# Patient Record
Sex: Female | Born: 1965 | Race: White | Hispanic: No | Marital: Married | Smoking: Never smoker
Health system: Southern US, Community
[De-identification: ages and names within clinical notes are randomized; demographics above are authoritative.]

## PROBLEM LIST (undated history)

## (undated) DIAGNOSIS — I82409 Acute embolism and thrombosis of unspecified deep veins of unspecified lower extremity: Secondary | ICD-10-CM

## (undated) DIAGNOSIS — Z973 Presence of spectacles and contact lenses: Secondary | ICD-10-CM

## (undated) DIAGNOSIS — G43909 Migraine, unspecified, not intractable, without status migrainosus: Secondary | ICD-10-CM

## (undated) DIAGNOSIS — E041 Nontoxic single thyroid nodule: Secondary | ICD-10-CM

## (undated) DIAGNOSIS — Z8489 Family history of other specified conditions: Secondary | ICD-10-CM

## (undated) DIAGNOSIS — I499 Cardiac arrhythmia, unspecified: Secondary | ICD-10-CM

## (undated) HISTORY — PX: CARPAL TUNNEL RELEASE: SHX101

---

## 2014-12-06 HISTORY — PX: COLONOSCOPY: SHX174

## 2019-10-01 ENCOUNTER — Other Ambulatory Visit: Payer: Self-pay

## 2019-10-01 ENCOUNTER — Ambulatory Visit: Admission: EM | Admit: 2019-10-01 | Discharge: 2019-10-01 | Discharge status: Against Medical Advice | Payer: 59

## 2019-10-01 NOTE — ED Triage Notes (Addendum)
Pt reports migraine that started yesterday AM. Pt took 50mg  her own Sumatriptan migraine medication and is wanting a refill.  During triage process, while patient is in treatment room,  patient's husband discovered that pt had 1 more refill left at [facility] and informs patient. Pt states that she wants to leave before being seen by doctor in order to avoid getting charged. This RN informed patient that Provider would be happy to see and treat patient, pt declines. Husband in lobby. Pt alert and oriented X4, cooperative, RR even and unlabored, color WNL. Pt in NAD. Pt states that Sumatriptan helps her migraines, hx of migraines. Pt ambulatory upon leaving prior to being seen by provider.

## 2019-11-14 ENCOUNTER — Encounter: Payer: Self-pay | Admitting: Family Medicine

## 2019-11-15 ENCOUNTER — Telehealth: Payer: Self-pay

## 2019-11-15 ENCOUNTER — Other Ambulatory Visit: Payer: Self-pay

## 2019-11-15 DIAGNOSIS — Z800 Family history of malignant neoplasm of digestive organs: Secondary | ICD-10-CM

## 2019-11-15 DIAGNOSIS — Z1211 Encounter for screening for malignant neoplasm of colon: Secondary | ICD-10-CM

## 2019-11-15 NOTE — Telephone Encounter (Signed)
Gastroenterology Pre-Procedure Review  Request Date: Monday 12/10/19 Requesting Physician: Dr. Allen Norris  PATIENT REVIEW QUESTIONS: The patient responded to the following health history questions as indicated:    1. Are you having any GI issues? no 2. Do you have a personal history of Polyps? no 3. Do you have a family history of Colon Cancer or Polyps? yes (mother colon cancer) 4. Diabetes Mellitus? no 5. Joint replacements in the past 12 months?no 6. Major health problems in the past 3 months?no 7. Any artificial heart valves, MVP, or defibrillator?no    MEDICATIONS & ALLERGIES:    Patient reports the following regarding taking any anticoagulation/antiplatelet therapy:   Plavix, Coumadin, Eliquis, Xarelto, Lovenox, Pradaxa, Brilinta, or Effient? no Aspirin? no  Patient confirms/reports the following medications:  No current outpatient medications on file.   No current facility-administered medications for this visit.    Patient confirms/reports the following allergies:  Not on File  No orders of the defined types were placed in this encounter.   AUTHORIZATION INFORMATION Primary Insurance: 1D#: Group #:  Secondary Insurance: 1D#: Group #:  SCHEDULE INFORMATION: Date: 12/10/19 Time: Location:[facility]

## 2019-11-28 ENCOUNTER — Encounter: Payer: Self-pay | Admitting: Gastroenterology

## 2019-11-28 ENCOUNTER — Other Ambulatory Visit: Payer: Self-pay

## 2019-12-03 ENCOUNTER — Other Ambulatory Visit: Payer: Self-pay

## 2019-12-03 MED ORDER — NA SULFATE-K SULFATE-MG SULF 17.5-3.13-1.6 GM/177ML PO SOLN: 1.0000 | Freq: Once | ORAL | 0 refills | Status: AC

## 2019-12-04 NOTE — Discharge Instructions (Signed)

## 2019-12-06 ENCOUNTER — Other Ambulatory Visit
Admission: RE | Admit: 2019-12-06 | Discharge: 2019-12-06 | Disposition: A | Discharge status: Home / Self Care | Payer: 59 | Source: Ambulatory Visit | Attending: Gastroenterology | Admitting: Gastroenterology

## 2019-12-06 DIAGNOSIS — Z01812 Encounter for preprocedural laboratory examination: Secondary | ICD-10-CM | POA: Insufficient documentation

## 2019-12-06 DIAGNOSIS — Z20828 Contact with and (suspected) exposure to other viral communicable diseases: Secondary | ICD-10-CM | POA: Diagnosis not present

## 2019-12-07 LAB — SARS CORONAVIRUS 2 (TAT 6-24 HRS): SARS Coronavirus 2: NEGATIVE

## 2019-12-10 ENCOUNTER — Encounter
Admission: RE | Disposition: A | Discharge status: Home / Self Care | Payer: Self-pay | Source: Home / Self Care | Attending: Gastroenterology

## 2019-12-10 ENCOUNTER — Ambulatory Visit: Payer: 59 | Admitting: Anesthesiology

## 2019-12-10 ENCOUNTER — Ambulatory Visit
Admission: RE | Admit: 2019-12-10 | Discharge: 2019-12-10 | Disposition: A | Discharge status: Home / Self Care | Payer: 59 | Attending: Gastroenterology | Admitting: Gastroenterology

## 2019-12-10 ENCOUNTER — Other Ambulatory Visit: Payer: Self-pay

## 2019-12-10 ENCOUNTER — Encounter: Payer: Self-pay | Admitting: Gastroenterology

## 2019-12-10 DIAGNOSIS — G43909 Migraine, unspecified, not intractable, without status migrainosus: Secondary | ICD-10-CM | POA: Diagnosis not present

## 2019-12-10 DIAGNOSIS — E669 Obesity, unspecified: Secondary | ICD-10-CM | POA: Insufficient documentation

## 2019-12-10 DIAGNOSIS — Z86010 Personal history of colonic polyps: Secondary | ICD-10-CM | POA: Insufficient documentation

## 2019-12-10 DIAGNOSIS — Z800 Family history of malignant neoplasm of digestive organs: Secondary | ICD-10-CM | POA: Diagnosis not present

## 2019-12-10 DIAGNOSIS — Z6833 Body mass index (BMI) 33.0-33.9, adult: Secondary | ICD-10-CM | POA: Diagnosis not present

## 2019-12-10 DIAGNOSIS — Z86718 Personal history of other venous thrombosis and embolism: Secondary | ICD-10-CM | POA: Insufficient documentation

## 2019-12-10 DIAGNOSIS — Z1211 Encounter for screening for malignant neoplasm of colon: Secondary | ICD-10-CM | POA: Insufficient documentation

## 2019-12-10 HISTORY — DX: Migraine, unspecified, not intractable, without status migrainosus: G43.909

## 2019-12-10 HISTORY — DX: Cardiac arrhythmia, unspecified: I49.9

## 2019-12-10 HISTORY — PX: COLONOSCOPY WITH PROPOFOL: SHX5780

## 2019-12-10 HISTORY — DX: Acute embolism and thrombosis of unspecified deep veins of unspecified lower extremity: I82.409

## 2019-12-10 HISTORY — DX: Nontoxic single thyroid nodule: E04.1

## 2019-12-10 HISTORY — DX: Presence of spectacles and contact lenses: Z97.3

## 2019-12-10 HISTORY — DX: Family history of other specified conditions: Z84.89

## 2019-12-10 SURGERY — COLONOSCOPY WITH PROPOFOL
Anesthesia: General | Site: Rectum

## 2019-12-10 MED ORDER — LIDOCAINE HCL (CARDIAC) PF 100 MG/5ML IV SOSY
PREFILLED_SYRINGE | INTRAVENOUS | Status: DC | PRN
  Administered 2019-12-10: 30 mg via INTRAVENOUS

## 2019-12-10 MED ORDER — PROPOFOL 10 MG/ML IV BOLUS
INTRAVENOUS | Status: DC | PRN
  Administered 2019-12-10: 40 mg via INTRAVENOUS
  Administered 2019-12-10 (×2): 20 mg via INTRAVENOUS
  Administered 2019-12-10: 50 mg via INTRAVENOUS
  Administered 2019-12-10: 20 mg via INTRAVENOUS
  Administered 2019-12-10: 50 mg via INTRAVENOUS
  Administered 2019-12-10: 20 mg via INTRAVENOUS
  Administered 2019-12-10: 50 mg via INTRAVENOUS
  Administered 2019-12-10: 100 mg via INTRAVENOUS
  Administered 2019-12-10: 30 mg via INTRAVENOUS

## 2019-12-10 MED ORDER — LACTATED RINGERS IV SOLN: INTRAVENOUS | Status: DC

## 2019-12-10 MED ORDER — STERILE WATER FOR IRRIGATION IR SOLN
Status: DC | PRN
  Administered 2019-12-10: 09:00:00 50 mL

## 2019-12-10 SURGICAL SUPPLY — 5 items
CANISTER SUCT 1200ML W/VALVE (MISCELLANEOUS) ×3 IMPLANT
GOWN CVR UNV OPN BCK APRN NK (MISCELLANEOUS) ×2 IMPLANT
GOWN ISOL THUMB LOOP REG UNIV (MISCELLANEOUS) ×4
KIT ENDO PROCEDURE OLY (KITS) ×3 IMPLANT
WATER STERILE IRR 250ML POUR (IV SOLUTION) ×3 IMPLANT

## 2019-12-10 NOTE — Anesthesia Procedure Notes (Signed)
Procedure Name: MAC Date/Time: 12/10/2019 8:30 AM Performed by: Georga Bora, CRNA Pre-anesthesia Checklist: Patient identified, Suction available, Emergency Drugs available, Patient being monitored and Timeout performed Patient Re-evaluated:Patient Re-evaluated prior to induction Oxygen Delivery Method: Nasal cannula

## 2019-12-10 NOTE — Anesthesia Postprocedure Evaluation (Signed)
Anesthesia Post Note  Patient: Brewing technologist  Procedure(s) Performed: COLONOSCOPY WITH PROPOFOL (N/A Rectum)     Patient location during evaluation: PACU Anesthesia Type: General Level of consciousness: awake and alert Pain management: pain level controlled Vital Signs Assessment: post-procedure vital signs reviewed and stable Respiratory status: spontaneous breathing, nonlabored ventilation, respiratory function stable and patient connected to nasal cannula oxygen Cardiovascular status: blood pressure returned to baseline and stable Postop Assessment: no apparent nausea or vomiting Anesthetic complications: no    Brittney Cooley

## 2019-12-10 NOTE — Transfer of Care (Signed)
Immediate Anesthesia Transfer of Care Note  Patient: Brittney Cooley  Procedure(s) Performed: COLONOSCOPY WITH PROPOFOL (N/A Rectum)  Patient Location: PACU  Anesthesia Type: General  Level of Consciousness: awake, alert  and patient cooperative  Airway and Oxygen Therapy: Patient Spontanous Breathing and Patient connected to supplemental oxygen  Post-op Assessment: Post-op Vital signs reviewed, Patient's Cardiovascular Status Stable, Respiratory Function Stable, Patent Airway and No signs of Nausea or vomiting  Post-op Vital Signs: Reviewed and stable  Complications: No apparent anesthesia complications

## 2019-12-10 NOTE — Anesthesia Preprocedure Evaluation (Addendum)
Anesthesia Evaluation  Patient identified by MRN, date of birth, ID band Patient awake    History of Anesthesia Complications (+) history of anesthetic complications ("slow to wake up")  Airway Mallampati: II  TM Distance: >3 FB   Mouth opening: Limited Mouth Opening  Dental no notable dental hx.    Pulmonary neg pulmonary ROS,    Pulmonary exam normal        Cardiovascular Exercise Tolerance: Good (-) anginanegative cardio ROS Normal cardiovascular exam     Neuro/Psych  Headaches (migraines),    GI/Hepatic negative GI ROS, Neg liver ROS,   Endo/Other  Obesity, BMI 33  Renal/GU negative Renal ROS     Musculoskeletal   Abdominal   Peds  Hematology negative hematology ROS (+)   Anesthesia Other Findings   Reproductive/Obstetrics                             Anesthesia Physical Anesthesia Plan  ASA: II  Anesthesia Plan: General   Post-op Pain Management:    Induction:   PONV Risk Score and Plan: 3 and TIVA, Propofol infusion and Treatment may vary due to age or medical condition  Airway Management Planned: Nasal Cannula and Natural Airway  Additional Equipment: None  Intra-op Plan:   Post-operative Plan:   Informed Consent: I have reviewed the patients History and Physical, chart, labs and discussed the procedure including the risks, benefits and alternatives for the proposed anesthesia with the patient or authorized representative who has indicated his/her understanding and acceptance.       Plan Discussed with: CRNA  Anesthesia Plan Comments:        Anesthesia Quick Evaluation

## 2019-12-10 NOTE — H&P (Signed)
Wyline Mood, MD [address], [address], [place], [place], [address] [address], [place], [place], [address] Phone: [phone]  Fax: [phone]  Primary Care Physician:  Dortha Kern, MD   Pre-Procedure History & Physical: HPI:  Brittney Cooley is a 54 y.o. female is here for an colonoscopy.   Past Medical History:  Diagnosis Date  . DVT (deep venous thrombosis) (HCC)    after Carpal tunnel surgery (approx 2017)  . Dysrhythmia    Pt reports occasional "skipped beat" years ago.  . Family history of adverse reaction to anesthesia    Mother - slow to wake  . Migraine headache    avg 2x/month  . Thyroid nodule   . Wears contact lenses    sometimes    Past Surgical History:  Procedure Laterality Date  . CARPAL TUNNEL RELEASE Bilateral    approx 2017.  [place]  . COLONOSCOPY  2016    Prior to Admission medications   Medication Sig Start Date End Date Taking? Authorizing Provider  SUMAtriptan (IMITREX) 100 MG tablet Take 50 mg by mouth every 2 (two) hours as needed for migraine. May repeat in 2 hours if headache persists or recurs.   Yes [provider]  valACYclovir (VALTREX) 500 MG tablet Take 500 mg by mouth 2 (two) times daily as needed.   Yes [provider]    Allergies as of 11/15/2019  . (Not on File)    History reviewed. No pertinent family history.  Social History   Socioeconomic History  . Marital status: Married    Spouse name: Not on file  . Number of children: Not on file  . Years of education: Not on file  . Highest education level: Not on file  Occupational History  . Not on file  Tobacco Use  . Smoking status: Never Smoker  . Smokeless tobacco: Never Used  Substance and Sexual Activity  . Alcohol use: Yes    Comment: wine on Holidays  . Drug use: Not on file  . Sexual activity: Not on file  Other Topics Concern  . Not on file  Social History Narrative  . Not on file   Social Determinants of Health     Financial Resource Strain:   . Difficulty of Paying Living Expenses: Not on file  Food Insecurity:   . Worried About Programme researcher, broadcasting/film/video in the Last Year: Not on file  . Ran Out of Food in the Last Year: Not on file  Transportation Needs:   . Lack of Transportation (Medical): Not on file  . Lack of Transportation (Non-Medical): Not on file  Physical Activity:   . Days of Exercise per Week: Not on file  . Minutes of Exercise per Session: Not on file  Stress:   . Feeling of Stress : Not on file  Social Connections:   . Frequency of Communication with Friends and Family: Not on file  . Frequency of Social Gatherings with Friends and Family: Not on file  . Attends Religious Services: Not on file  . Active Member of Clubs or Organizations: Not on file  . Attends Banker Meetings: Not on file  . Marital Status: Not on file  Intimate Partner Violence:   . Fear of Current or Ex-Partner: Not on file  . Emotionally Abused: Not on file  . Physically Abused: Not on file  . Sexually Abused: Not on file    Review of Systems: See HPI, otherwise negative ROS  Physical  Exam: BP (!) 116/51   Pulse 75   Temp (!) 97.5 F (36.4 C) (Temporal)   Resp 16   Ht 5\' 3"  (1.6 m)   Wt 85.3 kg   SpO2 97%   BMI 33.30 kg/m  General:   Alert,  pleasant and cooperative in NAD Head:  Normocephalic and atraumatic. Neck:  Supple; no masses or thyromegaly. Lungs:  Clear throughout to auscultation, normal respiratory effort.    Heart:  +S1, +S2, Regular rate and rhythm, No edema. Abdomen:  Soft, nontender and nondistended. Normal bowel sounds, without guarding, and without rebound.   Neurologic:  Alert and  oriented x4;  grossly normal neurologically.  Impression/Plan: Brittney Cooley is here for an colonoscopy to be performed for surveillance due to prior history of colon polyps .Last colonosocpy was in 2017 and she had polyps was told to return in 2021. Mother also had colon cancer and  passed in her 27's  Risks, benefits, limitations, and alternatives regarding  colonoscopy have been reviewed with the patient.  Questions have been answered.  All parties agreeable.   Jonathon Bellows, MD  12/10/2019, 8:01 AM

## 2019-12-10 NOTE — Op Note (Signed)
[facility] Gastroenterology Patient Name: Brittney Cooley Procedure Date: 12/10/2019 8:22 AM MRN: [id number] Account #: [id number] Date of Birth: 04-Jun-1966 Admit Type: Outpatient Age: 54 Room: [facility] OR ROOM 01 Gender: Female Note Status: Finalized Procedure:             Colonoscopy Indications:           High risk colon cancer surveillance: Personal history                         of colonic polyps Providers:             Wyline Mood MD, MD Referring MD:          Dortha Kern (Referring MD) Medicines:             Monitored Anesthesia Care Complications:         No immediate complications. Procedure:             Pre-Anesthesia Assessment:                        - Prior to the procedure, a History and Physical was                         performed, and patient medications, allergies and                         sensitivities were reviewed. The patient's tolerance                         of previous anesthesia was reviewed.                        - The risks and benefits of the procedure and the                         sedation options and risks were discussed with the                         patient. All questions were answered and informed                         consent was obtained.                        - ASA Grade Assessment: II - A patient with mild                         systemic disease.                        After obtaining informed consent, the colonoscope was                         passed under direct vision. Throughout the procedure,                         the patient's blood pressure, pulse, and oxygen                         saturations were monitored continuously. The was  introduced through the anus and advanced to the the                         cecum, identified by the appendiceal orifice. The                         colonoscopy was performed with ease. The patient                         tolerated the procedure well.  The quality of the bowel                         preparation was excellent. Findings:      The perianal and digital rectal examinations were normal.      The entire examined colon appeared normal on direct and retroflexion       views. Impression:            - The entire examined colon is normal on direct and                         retroflexion views.                        - No specimens collected. Recommendation:        - Discharge patient to home (with escort).                        - Resume previous diet.                        - Continue present medications.                        - Repeat colonoscopy in 5 years for surveillance. Procedure Code(s):     --- Professional ---                        [phone], Colonoscopy, flexible; diagnostic, including                         collection of specimen(s) by brushing or washing, when                         performed (separate procedure) Diagnosis Code(s):     --- Professional ---                        Z86.010, Personal history of colonic polyps CPT copyright 2019 American Medical Association. All rights reserved. The codes documented in this report are preliminary and upon coder review may  be revised to meet current compliance requirements. Jonathon Bellows, MD Jonathon Bellows MD, MD 12/10/2019 8:57:37 AM This report has been signed electronically. Number of Addenda: 0 Note Initiated On: 12/10/2019 8:22 AM Scope Withdrawal Time: 0 hours 17 minutes 22 seconds  Total Procedure Duration: 0 hours 22 minutes 0 seconds  Estimated Blood Loss:  Estimated blood loss: none.      [facility]

## 2019-12-11 ENCOUNTER — Encounter: Payer: Self-pay | Admitting: *Deleted

## 2020-01-31 ENCOUNTER — Other Ambulatory Visit: Payer: Self-pay | Admitting: Family Medicine

## 2020-01-31 DIAGNOSIS — E041 Nontoxic single thyroid nodule: Secondary | ICD-10-CM

## 2020-02-05 ENCOUNTER — Encounter (INDEPENDENT_AMBULATORY_CARE_PROVIDER_SITE_OTHER): Payer: Self-pay

## 2020-02-05 ENCOUNTER — Other Ambulatory Visit: Payer: Self-pay

## 2020-02-05 ENCOUNTER — Ambulatory Visit
Admission: RE | Admit: 2020-02-05 | Discharge: 2020-02-05 | Disposition: A | Discharge status: Home / Self Care | Payer: 59 | Source: Ambulatory Visit | Attending: Family Medicine | Admitting: Family Medicine

## 2020-02-05 DIAGNOSIS — E041 Nontoxic single thyroid nodule: Secondary | ICD-10-CM | POA: Diagnosis present

## 2021-03-12 ENCOUNTER — Other Ambulatory Visit: Payer: Self-pay | Admitting: Family Medicine

## 2021-03-12 DIAGNOSIS — E041 Nontoxic single thyroid nodule: Secondary | ICD-10-CM

## 2021-04-28 ENCOUNTER — Ambulatory Visit
Admission: RE | Admit: 2021-04-28 | Discharge: 2021-04-28 | Disposition: A | Discharge status: Home / Self Care | Payer: 59 | Source: Ambulatory Visit | Attending: Family Medicine | Admitting: Family Medicine

## 2021-04-28 DIAGNOSIS — E041 Nontoxic single thyroid nodule: Secondary | ICD-10-CM | POA: Diagnosis present

## 2021-06-27 ENCOUNTER — Other Ambulatory Visit: Payer: Self-pay

## 2021-06-27 ENCOUNTER — Ambulatory Visit (INDEPENDENT_AMBULATORY_CARE_PROVIDER_SITE_OTHER): Payer: 59

## 2021-06-27 ENCOUNTER — Encounter: Payer: Self-pay | Admitting: Emergency Medicine

## 2021-06-27 ENCOUNTER — Ambulatory Visit
Admission: EM | Admit: 2021-06-27 | Discharge: 2021-06-27 | Disposition: A | Discharge status: Home / Self Care | Payer: 59 | Attending: Sports Medicine | Admitting: Sports Medicine

## 2021-06-27 ENCOUNTER — Ambulatory Visit
Admission: RE | Admit: 2021-06-27 | Discharge: 2021-06-27 | Disposition: A | Discharge status: Home / Self Care | Payer: 59 | Source: Ambulatory Visit

## 2021-06-27 DIAGNOSIS — M79642 Pain in left hand: Secondary | ICD-10-CM

## 2021-06-27 DIAGNOSIS — W19XXXA Unspecified fall, initial encounter: Secondary | ICD-10-CM

## 2021-06-27 DIAGNOSIS — T07XXXA Unspecified multiple injuries, initial encounter: Secondary | ICD-10-CM

## 2021-06-27 DIAGNOSIS — S62327A Displaced fracture of shaft of fifth metacarpal bone, left hand, initial encounter for closed fracture: Secondary | ICD-10-CM

## 2021-06-27 NOTE — Discharge Instructions (Addendum)
-  Your hand is fractured.  You will need to follow-up with Ortho.  We placed you in a splint today.  Wear this as a cast.  Do not get it wet.  You can take ibuprofen and Tylenol as needed for pain.  Elevate the extremity.  Follow-up with Ortho in the next couple of days.  You have a condition requiring you to follow up with Orthopedics so please call one of the following office for appointment:   [facility] [address] [place], [place] [address] Phone: [phone]  [facility] [address] [place], [place] [address] Phone: [phone]

## 2021-06-27 NOTE — ED Provider Notes (Signed)
[facility]    CSN: [id number] Arrival date & time: 06/27/21  1006      History   Chief Complaint Chief Complaint  Patient presents with   Hand Pain    left    HPI Brittney Cooley is a 55 y.o. female presenting for left hand pain and swelling following an accidental fall from her porch last night.  She says she has most of the pain about the fifth digit and metacarpal of the fifth digit.  No numbness, tingling or weakness.  Patient is left-handed.  States that she stuck her hand out to brace herself in the fall.  She also has abrasions of her knees and left elbow.  No significant pain of her knees or elbow.  Patient most concerned about her hand.  She has iced the area and taking over-the-counter Tylenol and Motrin for discomfort which has seemed to help a little.  Patient says she has less pain whenever she does not move the hand.  Increased pain when she tries to make a fist.  Patient denies head injury or LOC.  No other complaints or concerns.  HPI  Past Medical History:  Diagnosis Date   DVT (deep venous thrombosis) (HCC)    after Carpal tunnel surgery (approx 2017)   Dysrhythmia    Pt reports occasional "skipped beat" years ago.   Family history of adverse reaction to anesthesia    Mother - slow to wake   Migraine headache    avg 2x/month   Thyroid nodule    Wears contact lenses    sometimes    There are no problems to display for this patient.   Past Surgical History:  Procedure Laterality Date   CARPAL TUNNEL RELEASE Bilateral    approx 2017.  [place]   COLONOSCOPY  2016   COLONOSCOPY WITH PROPOFOL N/A 12/10/2019   Procedure: COLONOSCOPY WITH PROPOFOL;  Surgeon: Wyline Mood, MD;  Location: [facility];  Service: Endoscopy;  Laterality: N/A;    OB History   No obstetric history on file.      Home Medications    Prior to Admission medications   Medication Sig Start Date End Date Taking? Authorizing Provider  SUMAtriptan (IMITREX)  100 MG tablet Take 50 mg by mouth every 2 (two) hours as needed for migraine. May repeat in 2 hours if headache persists or recurs.   Yes [provider]  valACYclovir (VALTREX) 500 MG tablet Take 500 mg by mouth 2 (two) times daily as needed.   Yes [provider]    Family History History reviewed. No pertinent family history.  Social History Social History   Tobacco Use   Smoking status: Never   Smokeless tobacco: Never  Vaping Use   Vaping Use: Never used  Substance Use Topics   Alcohol use: Yes    Comment: wine on Holidays   Drug use: Not Currently     Allergies   Patient has no known allergies.   Review of Systems Review of Systems  Musculoskeletal:  Positive for arthralgias and joint swelling. Negative for gait problem.  Skin:  Positive for color change and wound.  Neurological:  Negative for syncope, weakness and numbness.    Physical Exam Triage Vital Signs ED Triage Vitals  Enc Vitals Group     BP 06/27/21 1020 110/71     Pulse Rate 06/27/21 1020 66     Resp 06/27/21 1020 18     Temp 06/27/21 1020 98.2 F (36.8 C)  Temp Source 06/27/21 1020 Oral     SpO2 06/27/21 1020 100 %     Weight 06/27/21 1017 190 lb (86.2 kg)     Height 06/27/21 1017 5\' 3"  (1.6 m)     Head Circumference --      Peak Flow --      Pain Score 06/27/21 1016 4     Pain Loc --      Pain Edu? --      Excl. in GC? --    No data found.  Updated Vital Signs BP 110/71 (BP Location: Left Arm)   Pulse 66   Temp 98.2 F (36.8 C) (Oral)   Resp 18   Ht 5\' 3"  (1.6 m)   Wt 190 lb (86.2 kg)   SpO2 100%   BMI 33.66 kg/m      Physical Exam Vitals and nursing note reviewed.  Constitutional:      General: She is not in acute distress.    Appearance: Normal appearance. She is not ill-appearing or toxic-appearing.  HENT:     Head: Normocephalic and atraumatic.  Eyes:     General: No scleral icterus.       Right eye: No discharge.        Left eye: No discharge.      Extraocular Movements: Extraocular movements intact.     Conjunctiva/sclera: Conjunctivae normal.     Pupils: Pupils are equal, round, and reactive to light.  Cardiovascular:     Rate and Rhythm: Normal rate and regular rhythm.     Pulses: Normal pulses.     Heart sounds: Normal heart sounds.  Pulmonary:     Effort: Pulmonary effort is normal. No respiratory distress.     Breath sounds: Normal breath sounds.  Musculoskeletal:     Cervical back: Neck supple.     Comments: Left hand: Moderate swelling over the fifth metacarpal and MCP.  Diffuse tenderness in this region.  Also has minor overlying ecchymosis in this area.  Decreased range of motion with trying to make a fist.  Full range of motion of the fifth digit.  Good pulses, strength and sensation.  Skin:    General: Skin is dry.     Findings: Lesion (multiple superficial abrasions of bilateral anterior knees and left elbow.) present.  Neurological:     General: No focal deficit present.     Mental Status: She is alert and oriented to person, place, and time. Mental status is at baseline.     Cranial Nerves: No cranial nerve deficit.     Motor: No weakness.     Gait: Gait normal.  Psychiatric:        Mood and Affect: Mood normal.        Behavior: Behavior normal.        Thought Content: Thought content normal.     UC Treatments / Results  Labs (all labs ordered are listed, but only abnormal results are displayed) Labs Reviewed - No data to display  EKG   Radiology DG Hand Complete Left  Result Date: 06/27/2021 CLINICAL DATA:  Pain after fall. EXAM: LEFT HAND - COMPLETE 3+ VIEW COMPARISON:  None. FINDINGS: There is a fracture through the proximal fifth metacarpal diaphysis with displacement. IMPRESSION: There is a fracture through the proximal fifth metacarpal diaphysis with displacement. Electronically Signed   By: III M.D   On: 06/27/2021 10:34    Procedures Procedures (including critical care  time)  Medications Ordered in UC Medications -  No data to display  Initial Impression / Assessment and Plan / UC Course  I have reviewed the triage vital signs and the nursing notes.  Pertinent labs & imaging results that were available during my care of the patient were reviewed by me and considered in my medical decision making (see chart for details).  55 year old female presenting for multiple injuries following a accidental fall yesterday.  She has increased swelling, pain and bruising of the left hand specifically about the fifth metacarpal.  Of note she is left-hand dominant.  Additionally she has multiple abrasions of her knees and elbow.  She is up-to-date with her tetanus immunization.  She has clean these wounds and applied Band-Aids.  X-ray of left hand does show fracture through the proximal fifth metacarpal diaphysis with displacement.  Independently reviewed images. Reviewed this with patient.  Advised her this is a complicated fracture since it is displaced.  Advised her she will need to follow-up with orthopedics.  Patient immobilized with ulnar gutter splint.  Reviewed RICE guidelines and ibuprofen and Tylenol as needed for pain relief.  Advised her to go to [facility] or [facility] orthopedics in the next couple of days.  Reviewed wound care guidelines for her abrasions.  Advised follow-up for any signs of infection.  Final Clinical Impressions(s) / UC Diagnoses   Final diagnoses:  Closed displaced fracture of shaft of fifth metacarpal bone of left hand, initial encounter  Multiple abrasions  Fall, initial encounter     Discharge Instructions      -Your hand is fractured.  You will need to follow-up with Ortho.  We placed you in a splint today.  Wear this as a cast.  Do not get it wet.  You can take ibuprofen and Tylenol as needed for pain.  Elevate the extremity.  Follow-up with Ortho in the next couple of days.  You have a condition requiring you to follow up with  Orthopedics so please call one of the following office for appointment:   [facility] [address] [place], [place] [address] Phone: [phone]  [facility] [address] [place], [place] [address] Phone: [phone]      ED Prescriptions   None    PDMP not reviewed this encounter.   Shirlee Latch, PA-C 06/27/21 1432

## 2021-06-27 NOTE — ED Triage Notes (Signed)
Pt c/o left hand pain. She states she is fell off her porch last night. She has bruising and swelling.

## 2022-01-22 ENCOUNTER — Other Ambulatory Visit: Payer: Self-pay | Admitting: Family Medicine

## 2022-01-22 DIAGNOSIS — Z1231 Encounter for screening mammogram for malignant neoplasm of breast: Secondary | ICD-10-CM

## 2022-02-25 ENCOUNTER — Ambulatory Visit
Admission: RE | Admit: 2022-02-25 | Discharge: 2022-02-25 | Disposition: A | Discharge status: Home / Self Care | Payer: 59 | Source: Ambulatory Visit | Attending: Family Medicine | Admitting: Family Medicine

## 2022-02-25 ENCOUNTER — Other Ambulatory Visit: Payer: Self-pay

## 2022-02-25 DIAGNOSIS — Z1231 Encounter for screening mammogram for malignant neoplasm of breast: Secondary | ICD-10-CM | POA: Insufficient documentation

## 2022-03-25 ENCOUNTER — Other Ambulatory Visit: Payer: Self-pay | Admitting: Family Medicine

## 2022-03-25 DIAGNOSIS — E041 Nontoxic single thyroid nodule: Secondary | ICD-10-CM

## 2022-05-05 ENCOUNTER — Ambulatory Visit
Admission: RE | Admit: 2022-05-05 | Discharge: 2022-05-05 | Disposition: A | Discharge status: Home / Self Care | Payer: 59 | Source: Ambulatory Visit | Attending: Family Medicine | Admitting: Family Medicine

## 2022-05-05 DIAGNOSIS — E041 Nontoxic single thyroid nodule: Secondary | ICD-10-CM | POA: Insufficient documentation

## 2022-06-08 DIAGNOSIS — Z713 Dietary counseling and surveillance: Secondary | ICD-10-CM | POA: Insufficient documentation

## 2022-06-08 DIAGNOSIS — E66811 Obesity, class 1: Secondary | ICD-10-CM | POA: Insufficient documentation

## 2023-03-16 ENCOUNTER — Other Ambulatory Visit: Payer: Self-pay | Admitting: Family Medicine

## 2023-03-16 DIAGNOSIS — E041 Nontoxic single thyroid nodule: Secondary | ICD-10-CM

## 2023-03-24 ENCOUNTER — Ambulatory Visit
Admission: RE | Admit: 2023-03-24 | Discharge: 2023-03-24 | Disposition: A | Discharge status: Home / Self Care | Payer: 59 | Source: Ambulatory Visit | Attending: Family Medicine | Admitting: Family Medicine

## 2023-03-24 DIAGNOSIS — E041 Nontoxic single thyroid nodule: Secondary | ICD-10-CM | POA: Diagnosis not present

## 2023-06-18 IMAGING — MG MM DIGITAL SCREENING BILAT W/ TOMO AND CAD
8 series · 8 of 24 positions shown · non-contrast
Comparison: None.

CLINICAL DATA: Screening.

EXAM:
DIGITAL SCREENING BILATERAL MAMMOGRAM WITH TOMOSYNTHESIS AND CAD
TECHNIQUE: Bilateral screening digital craniocaudal and mediolateral oblique
mammograms were obtained. Bilateral screening digital breast
tomosynthesis was performed. The images were evaluated with
computer-aided detection.

[R MLO synth-2D]
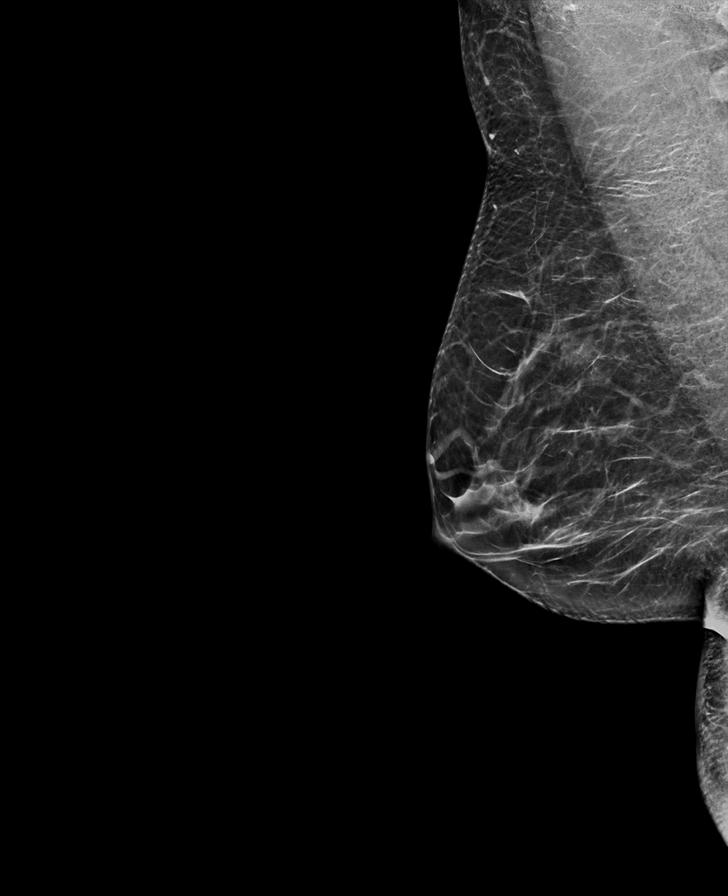

[R CC synth-2D]
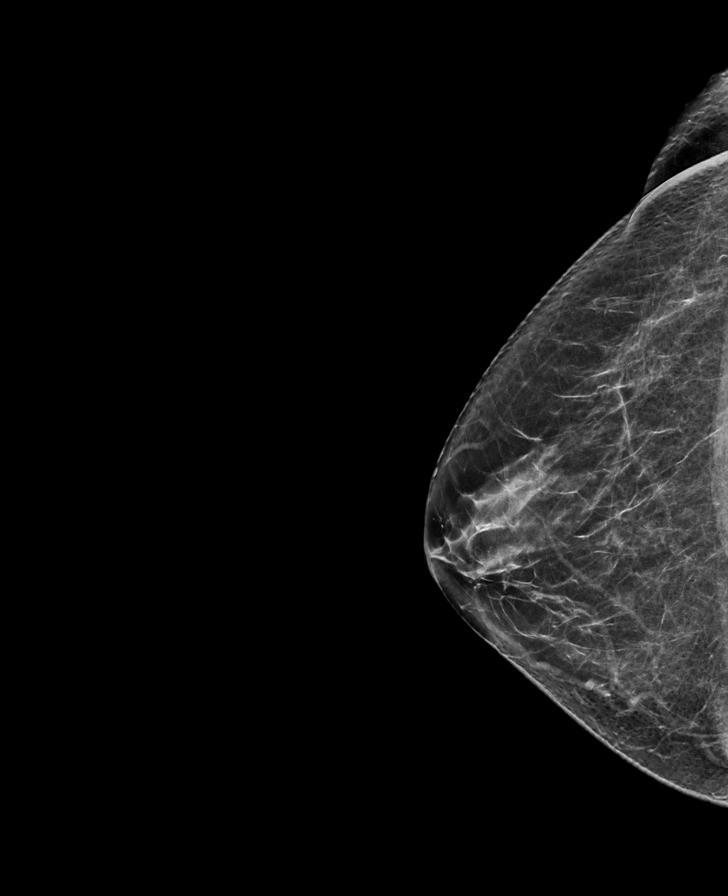

[L CC synth-2D]
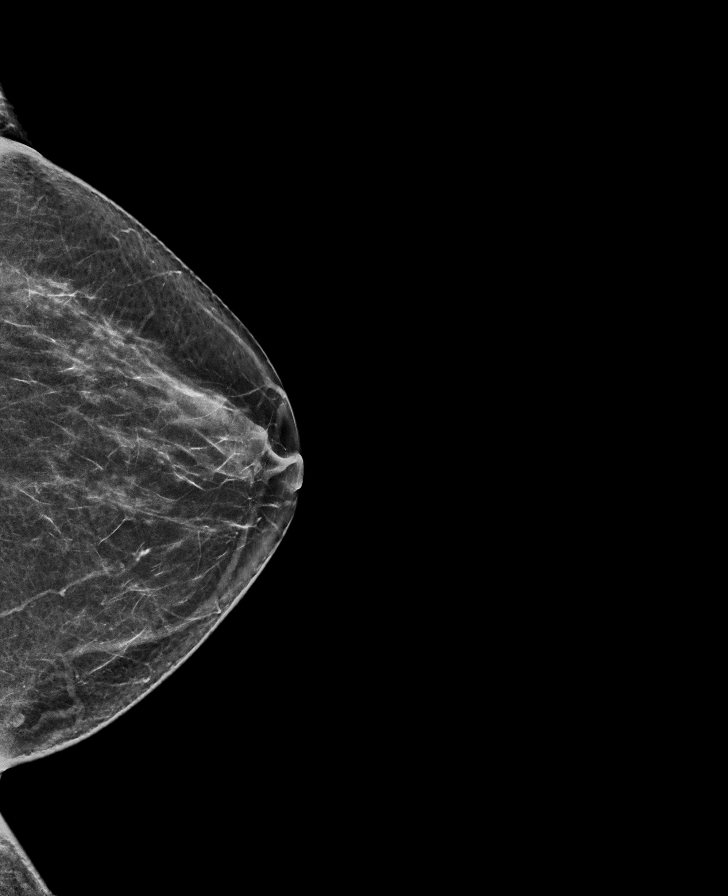

[L MLO synth-2D]
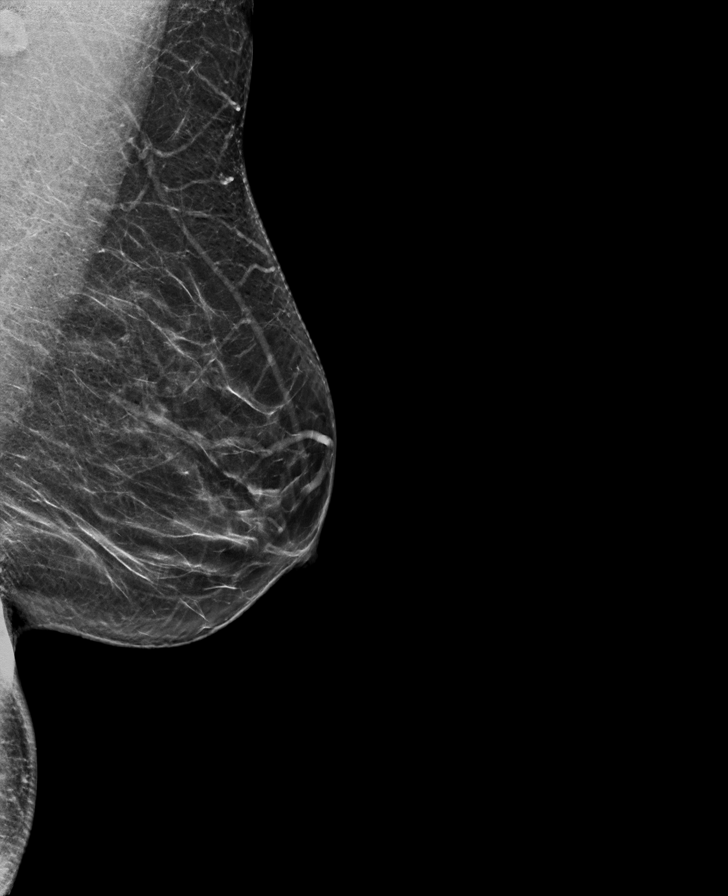

[L CC tomo · tomo slice 33/65.0]
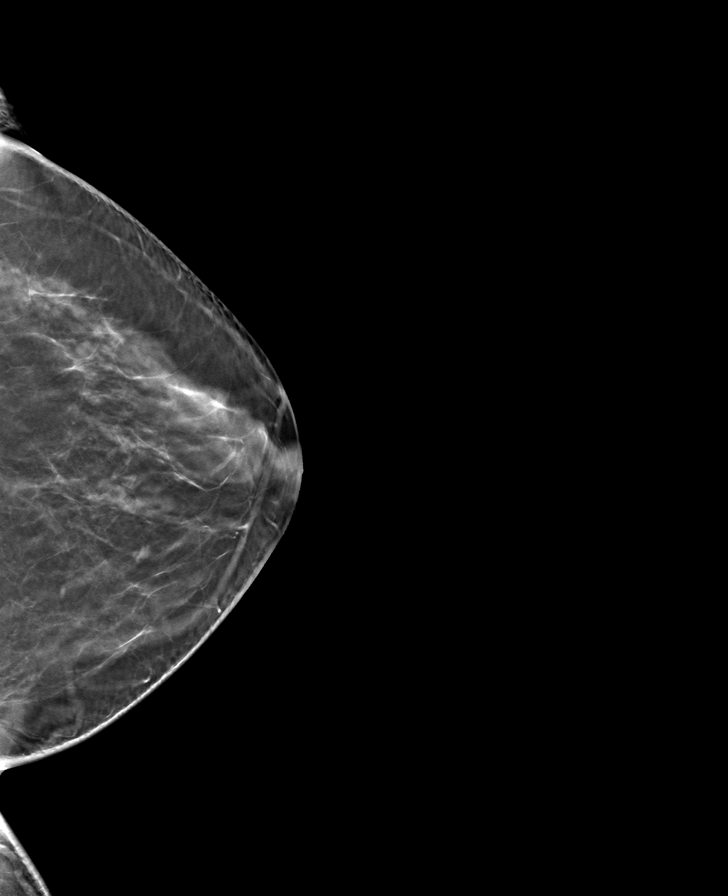

[L MLO tomo · tomo slice 37/73.0]
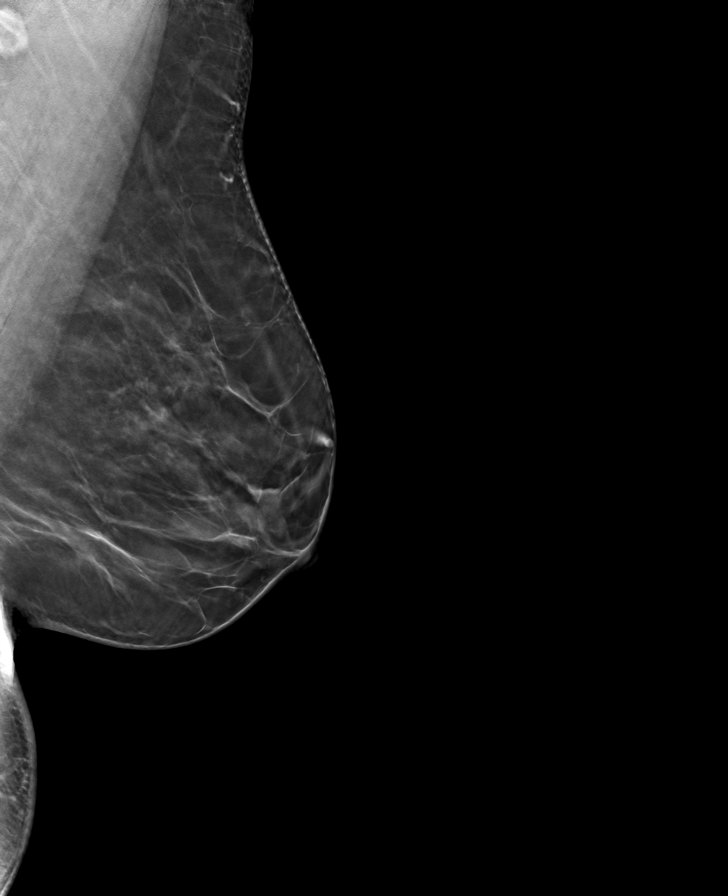

[R CC tomo · tomo slice 33/65.0]
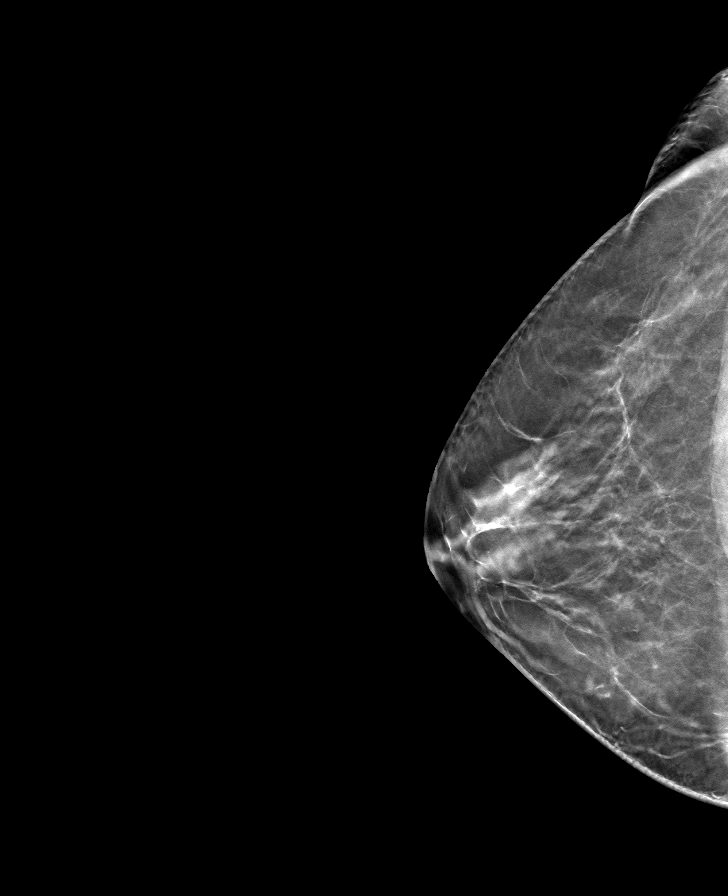

[R MLO tomo · tomo slice 36/71.0]
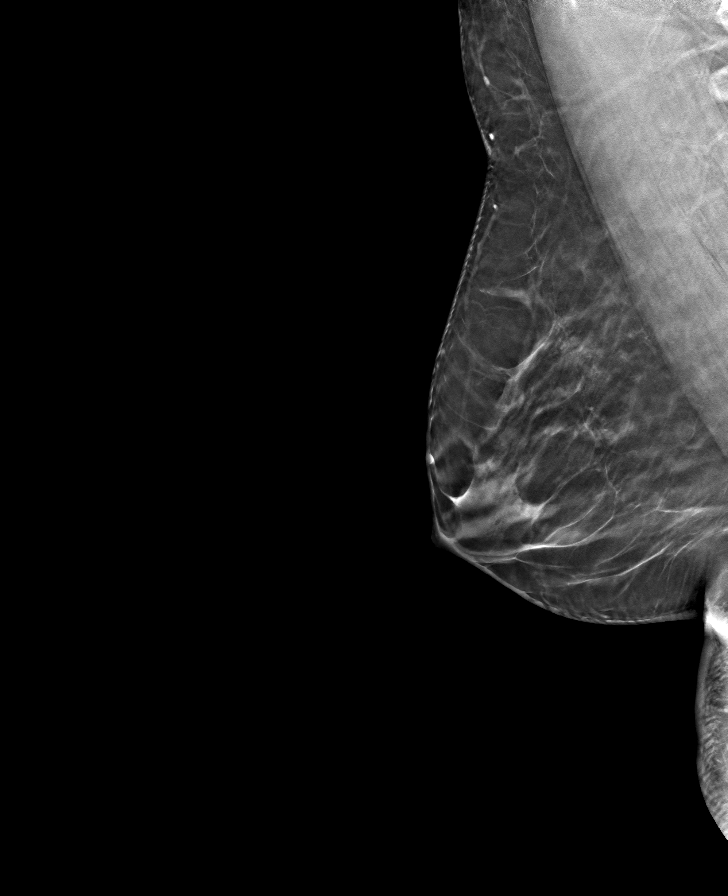

[8 of 24 positions shown; findings below may reference images not displayed]

ACR Breast Density Category c: The breast tissue is heterogeneously
dense, which may obscure small masses
FINDINGS: There are no findings suspicious for malignancy.
IMPRESSION: No mammographic evidence of malignancy. A result letter of this
screening mammogram will be mailed directly to the patient.

RECOMMENDATION:
Screening mammogram in one year. (Code:C8-T-HNK)

BI-RADS CATEGORY  1: Negative.

## 2023-08-26 IMAGING — US US THYROID
1 series · 13 of 25 positions shown · non-contrast
Comparison: 02/26/2021

CLINICAL DATA: Thyroid nodule follow-up

EXAM:
THYROID ULTRASOUND
TECHNIQUE: Ultrasound examination of the thyroid gland and adjacent soft
tissues was performed.

[Series 1: us thyroid · 0.07mm/px · 13 of 46 slices shown]
[im 1/46]
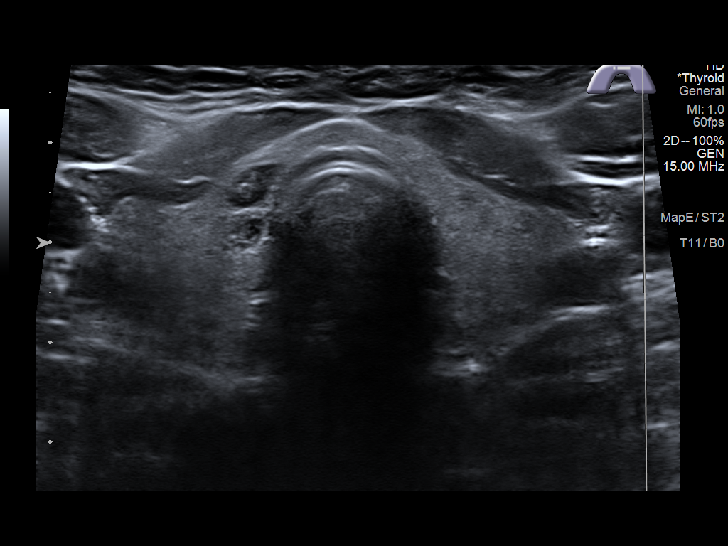
[im 4/46]
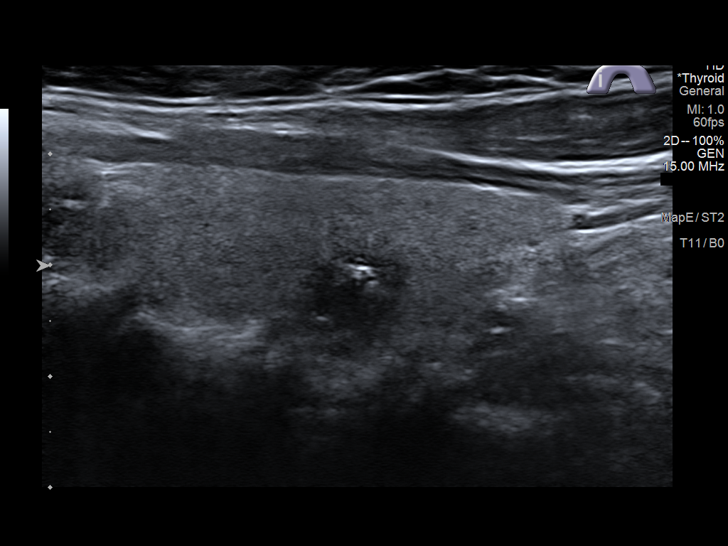
[im 8/46]
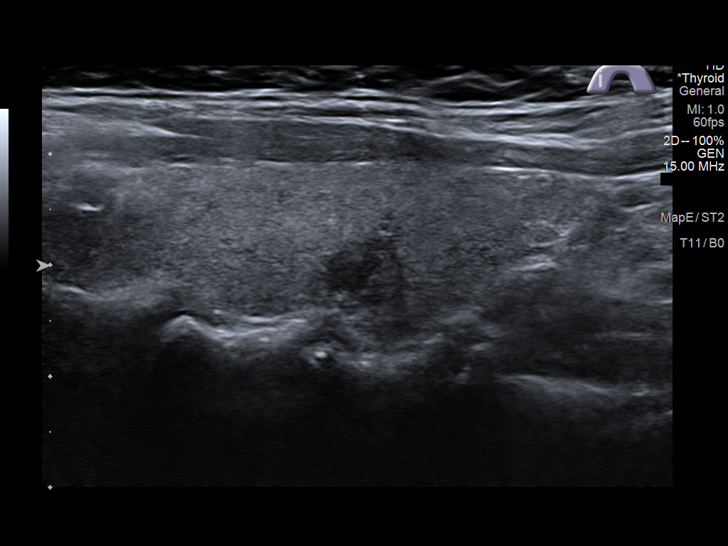
[im 12/46]
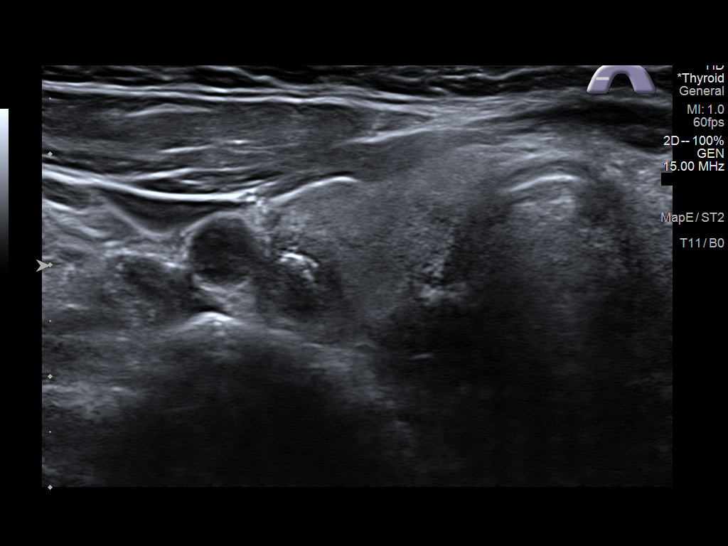
[im 16/46]
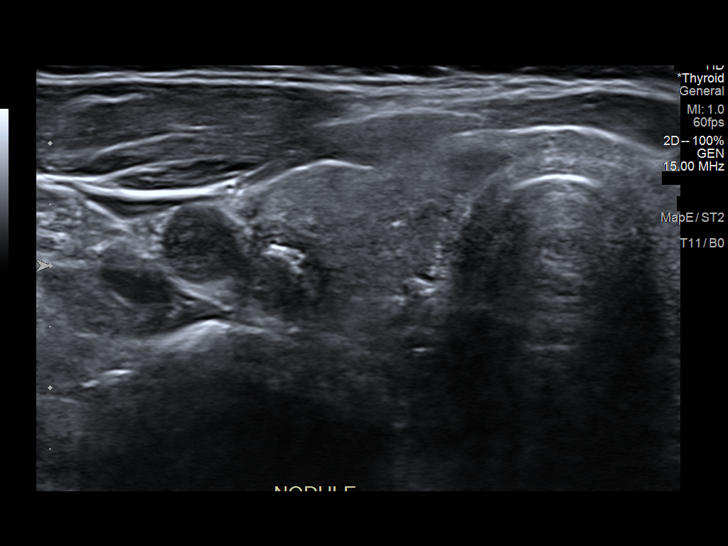
[im 19/46]
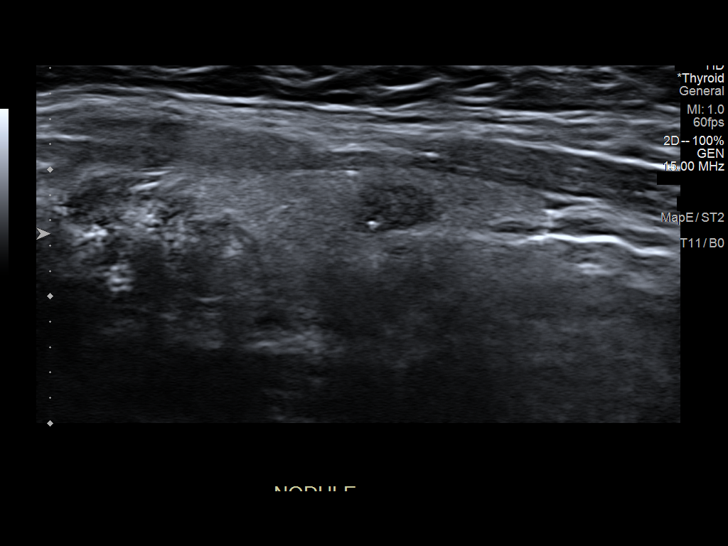
[im 23/46]
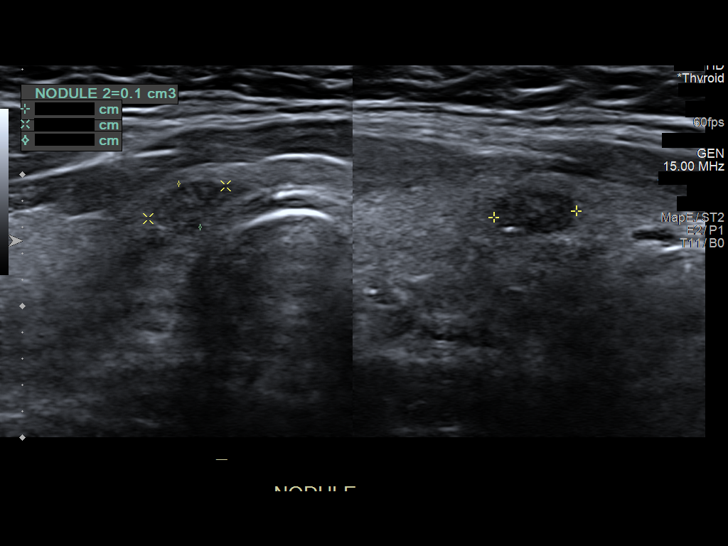
[im 27/46]
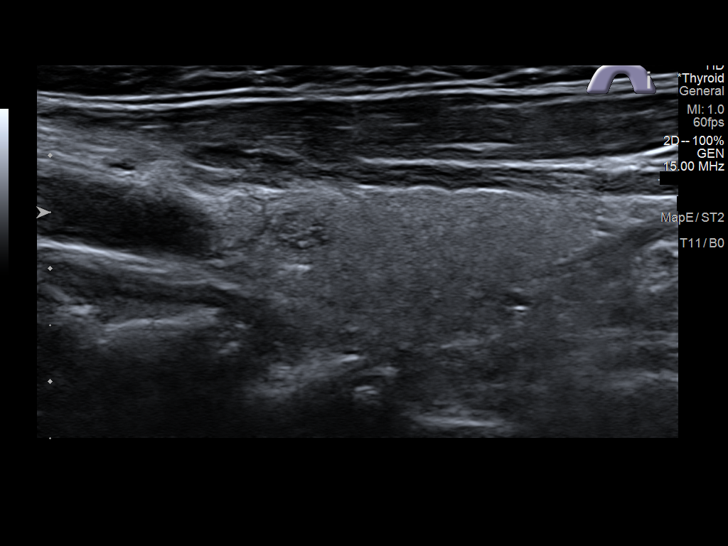
[im 31/46]
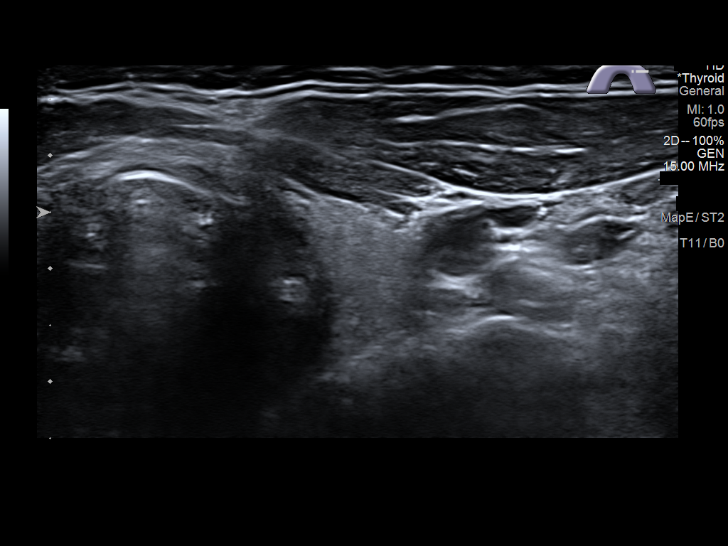
[im 34/46]
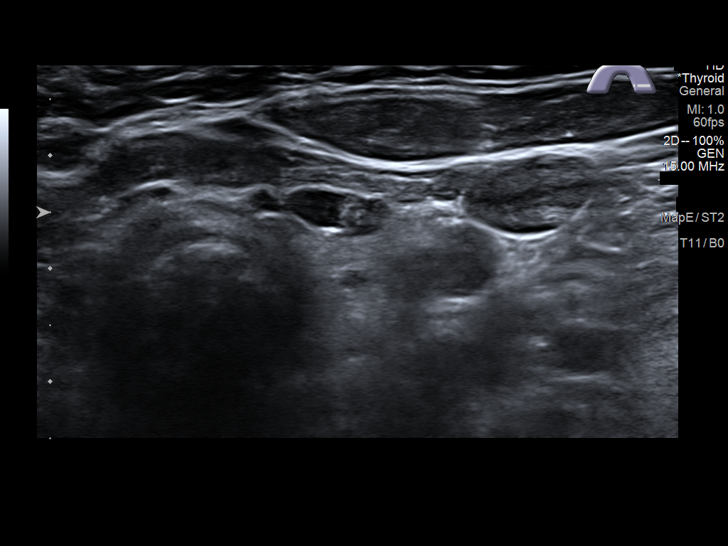
[im 38/46]
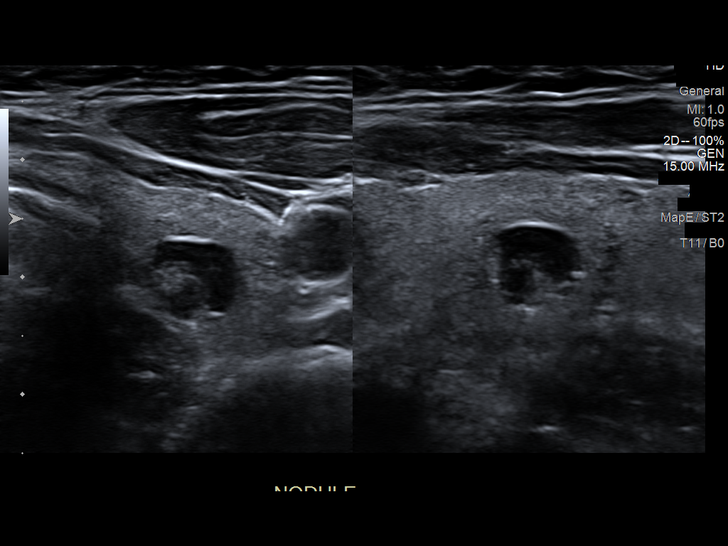
[im 42/46]
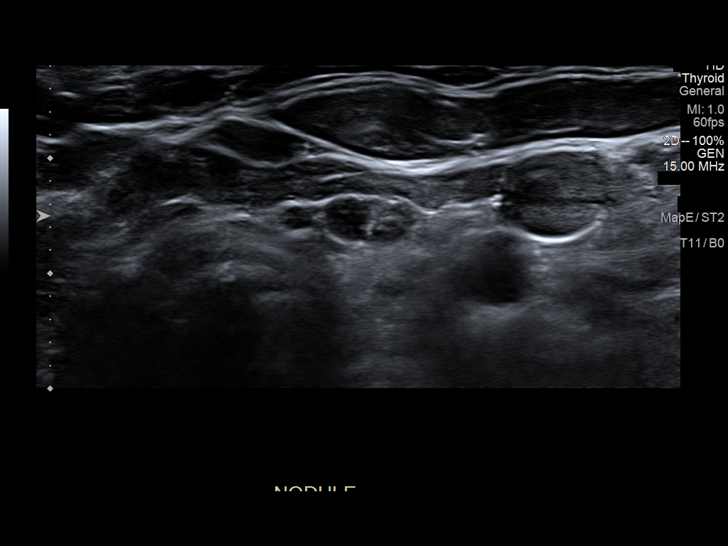
[im 46/46]
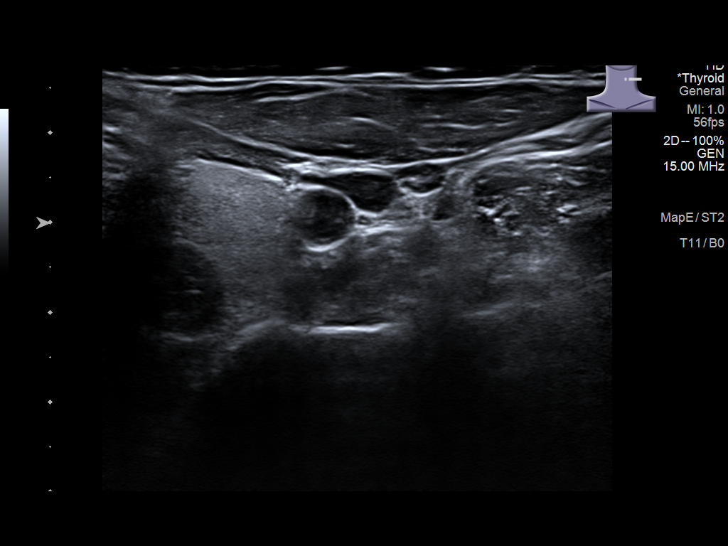

[13 of 25 positions shown; findings below may reference images not displayed]

FINDINGS: Parenchymal Echotexture: Mildly heterogenous

Isthmus: 0.3 cm

Right lobe: 4.2 x 1.6 x 1.8 cm

Left lobe: 4.5 x 1.7 x 1.6 cm

_________________________________________________________

Estimated total number of nodules >/= 1 cm: 1

Number of spongiform nodules >/=  2 cm not described below (TR1): 0

Number of mixed cystic and solid nodules >/= 1.5 cm not described
below (TR2): 0

_________________________________________________________

Nodule # 1:

Prior biopsy: No

Location: Right; inferior

Maximum size: 1.0 cm; Other 2 dimensions: 0.8 x 0.8 cm, previously,
1.1 x 1.0 x 0.8 cm

Composition: solid/almost completely solid (2)

Echogenicity: hypoechoic (2)

Shape: not taller-than-wide (0)

Margins: smooth (0)

Echogenic foci: macrocalcifications (1)

ACR TI-RADS total points: 5.

ACR TI-RADS risk category:  TR4 (4-6 points).

Significant change in size (>/= 20% in two dimensions and minimal
increase of 2 mm): No

Change in features: No

Change in ACR TI-RADS risk category: No

ACR TI-RADS recommendations:

*Given size (>/= 1 - 1.4 cm) and appearance, a follow-up ultrasound
in 1 year should be considered based on TI-RADS criteria.

_________________________________________________________

Additional subcentimeter nodules do not meet criteria for FNA or
further imaging follow-up.
IMPRESSION: Nodule 1 (TI-RADS 4), measuring 1.0 cm, located in the inferior
right thyroid lobe is unchanged in size since 02/05/2020 and still
meets criteria for imaging follow-up. Annual ultrasound surveillance
is recommended until 5 years of stability is documented.

The above is in keeping with the ACR TI-RADS recommendations - [HOSPITAL] 7881;[DATE].

## 2024-01-16 ENCOUNTER — Other Ambulatory Visit: Payer: Self-pay | Admitting: Family Medicine

## 2024-01-16 DIAGNOSIS — Z1231 Encounter for screening mammogram for malignant neoplasm of breast: Secondary | ICD-10-CM

## 2024-01-17 ENCOUNTER — Ambulatory Visit
Admission: RE | Admit: 2024-01-17 | Discharge: 2024-01-17 | Disposition: A | Discharge status: Home / Self Care | Payer: 59 | Source: Ambulatory Visit | Attending: Family Medicine | Admitting: Family Medicine

## 2024-01-17 ENCOUNTER — Other Ambulatory Visit: Payer: Self-pay | Admitting: Family Medicine

## 2024-01-17 DIAGNOSIS — Z1231 Encounter for screening mammogram for malignant neoplasm of breast: Secondary | ICD-10-CM | POA: Insufficient documentation

## 2024-01-17 DIAGNOSIS — E042 Nontoxic multinodular goiter: Secondary | ICD-10-CM

## 2024-02-15 ENCOUNTER — Other Ambulatory Visit: Payer: Self-pay

## 2024-02-15 ENCOUNTER — Telehealth: Payer: Self-pay

## 2024-02-15 DIAGNOSIS — Z800 Family history of malignant neoplasm of digestive organs: Secondary | ICD-10-CM

## 2024-02-15 DIAGNOSIS — Z1211 Encounter for screening for malignant neoplasm of colon: Secondary | ICD-10-CM

## 2024-02-15 MED ORDER — NA SULFATE-K SULFATE-MG SULF 17.5-3.13-1.6 GM/177ML PO SOLN: 1.0000 | Freq: Once | ORAL | 0 refills | Status: AC

## 2024-02-15 NOTE — Telephone Encounter (Signed)
 Gastroenterology Pre-Procedure Review  Request Date: 03/13/24 Requesting Physician: Dr. Tobi Bastos  PATIENT REVIEW QUESTIONS: The patient responded to the following health history questions as indicated:    1. Are you having any GI issues? no 2. Do you have a personal history of Polyps? no last colonoscopy performed 12/10/19 Dr. Tobi Bastos recommended 5 years pt requested to have it before recommended 5 years due to family history 3. Do you have a family history of Colon Cancer or Polyps? pt has 1st degree family member mother who had colon cancer dx at age 55, paternal uncle had colon cancer 4. Diabetes Mellitus? no 5. Joint replacements in the past 12 months?no 6. Major health problems in the past 3 months?no 7. Any artificial heart valves, MVP, or defibrillator?no    MEDICATIONS & ALLERGIES:    Patient reports the following regarding taking any anticoagulation/antiplatelet therapy:   Plavix, Coumadin, Eliquis, Xarelto, Lovenox, Pradaxa, Brilinta, or Effient? no Aspirin? no  Patient confirms/reports the following medications:  Current Outpatient Medications  Medication Sig Dispense Refill   Na Sulfate-K Sulfate-Mg Sulfate concentrate (SUPREP) 17.5-3.13-1.6 GM/177ML SOLN Take 1 kit (354 mLs total) by mouth once for 1 dose. 354 mL 0   SUMAtriptan (IMITREX) 100 MG tablet Take 50 mg by mouth every 2 (two) hours as needed for migraine. May repeat in 2 hours if headache persists or recurs.     valACYclovir (VALTREX) 500 MG tablet Take 500 mg by mouth 2 (two) times daily as needed.     No current facility-administered medications for this visit.    Patient confirms/reports the following allergies:  No Known Allergies  No orders of the defined types were placed in this encounter.   AUTHORIZATION INFORMATION Primary Insurance: 1D#: Group #:  Secondary Insurance: 1D#: Group #:  SCHEDULE INFORMATION: Date: 03/13/24 Time: Location: [facility]

## 2024-03-12 ENCOUNTER — Encounter: Payer: Self-pay | Admitting: Gastroenterology

## 2024-03-13 ENCOUNTER — Ambulatory Visit: Admitting: Anesthesiology

## 2024-03-13 ENCOUNTER — Ambulatory Visit
Admission: RE | Admit: 2024-03-13 | Discharge: 2024-03-13 | Disposition: A | Discharge status: Home / Self Care | Attending: Gastroenterology | Admitting: Gastroenterology

## 2024-03-13 ENCOUNTER — Encounter
Admission: RE | Disposition: A | Discharge status: Home / Self Care | Payer: Self-pay | Source: Home / Self Care | Attending: Gastroenterology

## 2024-03-13 ENCOUNTER — Encounter: Payer: Self-pay | Admitting: Gastroenterology

## 2024-03-13 DIAGNOSIS — D122 Benign neoplasm of ascending colon: Secondary | ICD-10-CM | POA: Diagnosis not present

## 2024-03-13 DIAGNOSIS — K635 Polyp of colon: Secondary | ICD-10-CM | POA: Diagnosis not present

## 2024-03-13 DIAGNOSIS — Z1211 Encounter for screening for malignant neoplasm of colon: Secondary | ICD-10-CM | POA: Diagnosis present

## 2024-03-13 DIAGNOSIS — Z86718 Personal history of other venous thrombosis and embolism: Secondary | ICD-10-CM | POA: Insufficient documentation

## 2024-03-13 DIAGNOSIS — D126 Benign neoplasm of colon, unspecified: Secondary | ICD-10-CM

## 2024-03-13 DIAGNOSIS — Z800 Family history of malignant neoplasm of digestive organs: Secondary | ICD-10-CM

## 2024-03-13 DIAGNOSIS — K5730 Diverticulosis of large intestine without perforation or abscess without bleeding: Secondary | ICD-10-CM | POA: Insufficient documentation

## 2024-03-13 HISTORY — PX: POLYPECTOMY: SHX149

## 2024-03-13 HISTORY — PX: COLONOSCOPY: SHX5424

## 2024-03-13 SURGERY — COLONOSCOPY
Anesthesia: General

## 2024-03-13 MED ORDER — LIDOCAINE HCL (CARDIAC) PF 100 MG/5ML IV SOSY
PREFILLED_SYRINGE | INTRAVENOUS | Status: DC | PRN
  Administered 2024-03-13: 60 mg via INTRAVENOUS

## 2024-03-13 MED ORDER — PROPOFOL 10 MG/ML IV BOLUS
INTRAVENOUS | Status: DC | PRN
  Administered 2024-03-13: 70 mg via INTRAVENOUS

## 2024-03-13 MED ORDER — PROPOFOL 10 MG/ML IV BOLUS
INTRAVENOUS | Status: AC
  Filled 2024-03-13: qty 20

## 2024-03-13 MED ORDER — PROPOFOL 500 MG/50ML IV EMUL
INTRAVENOUS | Status: DC | PRN
  Administered 2024-03-13: 140 ug/kg/min via INTRAVENOUS

## 2024-03-13 MED ORDER — SODIUM CHLORIDE 0.9 % IV SOLN: INTRAVENOUS | Status: DC

## 2024-03-13 MED ORDER — LIDOCAINE HCL (PF) 2 % IJ SOLN
INTRAMUSCULAR | Status: AC
  Filled 2024-03-13: qty 5

## 2024-03-13 NOTE — Transfer of Care (Signed)
 Immediate Anesthesia Transfer of Care Note  Patient: Brittney Cooley  Procedure(s) Performed: COLONOSCOPY POLYPECTOMY, CERVIX  Patient Location: Endoscopy Unit  Anesthesia Type:General  Level of Consciousness: drowsy  Airway & Oxygen Therapy: Patient Spontanous Breathing  Post-op Assessment: Report given to RN and Post -op Vital signs reviewed and stable  Post vital signs: Reviewed and stable  Last Vitals:  Vitals Value Taken Time  BP 101/54 03/13/24 1006  Temp 36.1 C 03/13/24 1005  Pulse 75 03/13/24 1006  Resp 25 03/13/24 1006  SpO2 95 % 03/13/24 1006  Vitals shown include unfiled device data.  Last Pain:  Vitals:   03/13/24 1005  TempSrc: Temporal  PainSc: Asleep         Complications: No notable events documented.

## 2024-03-13 NOTE — Anesthesia Preprocedure Evaluation (Signed)
 Anesthesia Evaluation  Patient identified by MRN, date of birth, ID band Patient awake    Reviewed: Allergy & Precautions, NPO status , Patient's Chart, lab work & pertinent test results  History of Anesthesia Complications (+) Family history of anesthesia reaction and history of anesthetic complications  Airway Mallampati: III  TM Distance: >3 FB Neck ROM: full    Dental  (+) Chipped   Pulmonary neg pulmonary ROS, neg shortness of breath   Pulmonary exam normal        Cardiovascular (-) angina Normal cardiovascular exam+ dysrhythmias      Neuro/Psych  Headaches  negative psych ROS   GI/Hepatic negative GI ROS, Neg liver ROS,neg GERD  ,,  Endo/Other  negative endocrine ROS    Renal/GU negative Renal ROS  negative genitourinary   Musculoskeletal   Abdominal   Peds  Hematology negative hematology ROS (+)   Anesthesia Other Findings Past Medical History: No date: DVT (deep venous thrombosis) (HCC)     Comment:  after Carpal tunnel surgery (approx 2017) No date: Dysrhythmia     Comment:  Pt reports occasional "skipped beat" years ago. No date: Family history of adverse reaction to anesthesia     Comment:  Mother - slow to wake No date: Migraine headache     Comment:  avg 2x/month No date: Thyroid nodule No date: Wears contact lenses     Comment:  sometimes  Past Surgical History: No date: CARPAL TUNNEL RELEASE; Bilateral     Comment:  approx 2017.  [place] No date: CESAREAN SECTION 2016: COLONOSCOPY 12/10/2019: COLONOSCOPY WITH PROPOFOL; N/A     Comment:  Procedure: COLONOSCOPY WITH PROPOFOL;  Surgeon: Wyline Mood, MD;  Location: [facility];  Service:               Endoscopy;  Laterality: N/A;  BMI    Body Mass Index: 34.01 kg/m      Reproductive/Obstetrics negative OB ROS                             Anesthesia Physical Anesthesia Plan  ASA:  2  Anesthesia Plan: General   Post-op Pain Management:    Induction: Intravenous  PONV Risk Score and Plan: Propofol infusion and TIVA  Airway Management Planned: Natural Airway and Nasal Cannula  Additional Equipment:   Intra-op Plan:   Post-operative Plan:   Informed Consent: I have reviewed the patients History and Physical, chart, labs and discussed the procedure including the risks, benefits and alternatives for the proposed anesthesia with the patient or authorized representative who has indicated his/her understanding and acceptance.     Dental Advisory Given  Plan Discussed with: Anesthesiologist, CRNA and Surgeon  Anesthesia Plan Comments: (Patient consented for risks of anesthesia including but not limited to:  - adverse reactions to medications - risk of airway placement if required - damage to eyes, teeth, lips or other oral mucosa - nerve damage due to positioning  - sore throat or hoarseness - Damage to heart, brain, nerves, lungs, other parts of body or loss of life  Patient voiced understanding and assent.)       Anesthesia Quick Evaluation

## 2024-03-13 NOTE — Anesthesia Postprocedure Evaluation (Signed)
 Anesthesia Post Note  Patient: Brittney Cooley  Procedure(s) Performed: COLONOSCOPY POLYPECTOMY, CERVIX  Patient location during evaluation: Endoscopy Anesthesia Type: General Level of consciousness: awake and alert Pain management: pain level controlled Vital Signs Assessment: post-procedure vital signs reviewed and stable Respiratory status: spontaneous breathing, nonlabored ventilation, respiratory function stable and patient connected to nasal cannula oxygen Cardiovascular status: blood pressure returned to baseline and stable Postop Assessment: no apparent nausea or vomiting Anesthetic complications: no   No notable events documented.   Last Vitals:  Vitals:   03/13/24 1015 03/13/24 1025  BP: (!) 107/56 (!) 105/57  Pulse:    Resp: 19   Temp:    SpO2:      Last Pain:  Vitals:   03/13/24 1025  TempSrc:   PainSc: 0-No pain                 Cleda Mccreedy Ismeal Heider

## 2024-03-13 NOTE — Op Note (Signed)
 [facility] Gastroenterology Patient Name: Brittney Cooley Procedure Date: 03/13/2024 9:38 AM MRN: [id number] Account #: [id number] Date of Birth: Oct 14, 1966 Admit Type: Outpatient Age: 58 Room: [facility] ENDO ROOM 2 Gender: Female Note Status: Finalized Instrument Name: Prentice Docker [id number] Procedure:             Colonoscopy Indications:           Screening in patient at increased risk: Family history                         of 1st-degree relative with colorectal cancer Providers:             Wyline Mood MD, MD Referring MD:          Dortha Kern (Referring MD) Medicines:             Monitored Anesthesia Care Complications:         No immediate complications. Procedure:             Pre-Anesthesia Assessment:                        - Prior to the procedure, a History and Physical was                         performed, and patient medications, allergies and                         sensitivities were reviewed. The patient's tolerance                         of previous anesthesia was reviewed.                        - The risks and benefits of the procedure and the                         sedation options and risks were discussed with the                         patient. All questions were answered and informed                         consent was obtained.                        - ASA Grade Assessment: II - A patient with mild                         systemic disease.                        After obtaining informed consent, the colonoscope was                         passed under direct vision. Throughout the procedure,                         the patient's blood pressure, pulse, and oxygen  saturations were monitored continuously. The                         Colonoscope was introduced through the anus and                         advanced to the the cecum, identified by the                         appendiceal orifice. The colonoscopy was performed                          with ease. The patient tolerated the procedure well.                         The quality of the bowel preparation was excellent.                         The ileocecal valve, appendiceal orifice, and rectum                         were photographed. Findings:      The perianal and digital rectal examinations were normal.      Two sessile polyps were found in the ascending colon. The polyps were 3       to 4 mm in size. These polyps were removed with a cold biopsy forceps.       Resection and retrieval were complete.      Multiple small-mouthed diverticula were found in the sigmoid colon.      The exam was otherwise without abnormality on direct and retroflexion       views. Impression:            - Two 3 to 4 mm polyps in the ascending colon, removed                         with a cold biopsy forceps. Resected and retrieved.                        - Diverticulosis in the sigmoid colon.                        - The examination was otherwise normal on direct and                         retroflexion views. Recommendation:        - Discharge patient to home (with escort).                        - Resume previous diet.                        - Continue present medications.                        - Await pathology results.                        - Repeat colonoscopy in 5 years for surveillance. Procedure Code(s):     --- Professional ---  40981, Colonoscopy, flexible; with biopsy, single or                         multiple Diagnosis Code(s):     --- Professional ---                        Z80.0, Family history of malignant neoplasm of                         digestive organs                        D12.2, Benign neoplasm of ascending colon                        K57.30, Diverticulosis of large intestine without                         perforation or abscess without bleeding CPT copyright 2022 American Medical Association. All rights reserved. The  codes documented in this report are preliminary and upon coder review may  be revised to meet current compliance requirements. Wyline Mood, MD Wyline Mood MD, MD 03/13/2024 10:03:47 AM This report has been signed electronically. Number of Addenda: 0 Note Initiated On: 03/13/2024 9:38 AM Scope Withdrawal Time: 0 hours 11 minutes 11 seconds  Total Procedure Duration: 0 hours 18 minutes 7 seconds  Estimated Blood Loss:  Estimated blood loss: none.      [facility]

## 2024-03-13 NOTE — H&P (Signed)
 Wyline Mood, MD [address], [address], [place], [place], [address] [address], [place], [place], [address] Phone: [phone]  Fax: [phone]  Primary Care Physician:  Dortha Kern, MD   Pre-Procedure History & Physical: HPI:  Brittney Cooley is a 58 y.o. female is here for an colonoscopy.   Past Medical History:  Diagnosis Date   DVT (deep venous thrombosis) (HCC)    after Carpal tunnel surgery (approx 2017)   Dysrhythmia    Pt reports occasional "skipped beat" years ago.   Family history of adverse reaction to anesthesia    Mother - slow to wake   Migraine headache    avg 2x/month   Thyroid nodule    Wears contact lenses    sometimes    Past Surgical History:  Procedure Laterality Date   CARPAL TUNNEL RELEASE Bilateral    approx 2017.  [place]   COLONOSCOPY  2016   COLONOSCOPY WITH PROPOFOL N/A 12/10/2019   Procedure: COLONOSCOPY WITH PROPOFOL;  Surgeon: Wyline Mood, MD;  Location: [facility];  Service: Endoscopy;  Laterality: N/A;    Prior to Admission medications   Medication Sig Start Date End Date Taking? Authorizing Provider  SUMAtriptan (IMITREX) 100 MG tablet Take 50 mg by mouth every 2 (two) hours as needed for migraine. May repeat in 2 hours if headache persists or recurs.    [provider]  valACYclovir (VALTREX) 500 MG tablet Take 500 mg by mouth 2 (two) times daily as needed.    [provider]    Allergies as of 02/15/2024   (No Known Allergies)    Family History  Problem Relation Age of Onset   Breast cancer Neg Hx     Social History   Socioeconomic History   Marital status: Married    Spouse name: Not on file   Number of children: Not on file   Years of education: Not on file   Highest education level: Not on file  Occupational History   Not on file  Tobacco Use   Smoking status: Never   Smokeless tobacco: Never  Vaping Use   Vaping status: Never Used  Substance and Sexual  Activity   Alcohol use: Yes    Comment: wine on Holidays   Drug use: Not Currently   Sexual activity: Not on file  Other Topics Concern   Not on file  Social History Narrative   Not on file   Social Drivers of Health   Financial Resource Strain: Patient Declined (10/16/2022)   Received from [facility], [facility]   Overall Financial Resource Strain (CARDIA)    Difficulty of Paying Living Expenses: Patient declined  Food Insecurity: Patient Declined (10/16/2022)   Received from [facility], [facility]   Hunger Vital Sign    Worried About Running Out of Food in the Last Year: Patient declined    Ran Out of Food in the Last Year: Patient declined  Transportation Needs: Patient Declined (10/16/2022)   Received from [facility], [facility]   [facility] - Transportation    Lack of Transportation (Medical): Patient declined    Lack of Transportation (Non-Medical): Patient declined  Physical Activity: Not on file  Stress: Not on file  Social Connections: Not on file  Intimate Partner Violence: Not on file    Review of Systems: See HPI, otherwise negative ROS  Physical Exam: There were no vitals taken for this visit. General:  Alert,  pleasant and cooperative in NAD Head:  Normocephalic and atraumatic. Neck:  Supple; no masses or thyromegaly. Lungs:  Clear throughout to auscultation, normal respiratory effort.    Heart:  +S1, +S2, Regular rate and rhythm, No edema. Abdomen:  Soft, nontender and nondistended. Normal bowel sounds, without guarding, and without rebound.   Neurologic:  Alert and  oriented x4;  grossly normal neurologically.  Impression/Plan: Brittney Cooley is here for an colonoscopy to be performed for Screening colonoscopy family h istory of colon cancer Risks, benefits, limitations, and alternatives regarding  colonoscopy have been reviewed with the patient.  Questions have been answered.  All parties agreeable.   Wyline Mood,  MD  03/13/2024, 8:40 AM

## 2024-03-14 ENCOUNTER — Encounter: Payer: Self-pay | Admitting: Gastroenterology

## 2024-03-14 LAB — SURGICAL PATHOLOGY

## 2024-03-15 ENCOUNTER — Encounter: Payer: Self-pay | Admitting: Gastroenterology

## 2024-03-27 ENCOUNTER — Ambulatory Visit
Admission: RE | Admit: 2024-03-27 | Discharge: 2024-03-27 | Disposition: A | Discharge status: Home / Self Care | Payer: 59 | Source: Ambulatory Visit | Attending: Family Medicine | Admitting: Family Medicine

## 2024-03-27 DIAGNOSIS — E042 Nontoxic multinodular goiter: Secondary | ICD-10-CM | POA: Insufficient documentation
